# Patient Record
Sex: Male | Born: 1990 | Race: White | Hispanic: No | Marital: Single | Smoking: Current every day smoker
Health system: Southern US, Community
[De-identification: ages and names within clinical notes are randomized; demographics above are authoritative.]

---
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
## 2012-08-09 LAB — BASIC METABOLIC PANEL
Calcium, Total: 9.2 mg/dL (ref 8.5–10.1)
Co2: 31 mmol/L (ref 21–32)
Creatinine: 0.87 mg/dL (ref 0.60–1.30)
EGFR (African American): >60
Osmolality: 276 (ref 275–301)
Sodium: 139 mmol/L (ref 136–145)

## 2012-08-09 LAB — CBC WITH DIFFERENTIAL/PLATELET
Basophil #: 0 10*3/uL (ref 0.0–0.1)
Basophil %: 0.4 %
HGB: 15.2 g/dL (ref 13.0–18.0)
Lymphocyte #: 2.4 10*3/uL (ref 1.0–3.6)
Lymphocyte %: 27.5 %
MCH: 29.2 pg (ref 26.0–34.0)
MCV: 84 fL (ref 80–100)
Monocyte #: 0.6 x10 3/mm (ref 0.2–1.0)
Platelet: 231 10*3/uL (ref 150–440)
RBC: 5.19 10*6/uL (ref 4.40–5.90)
WBC: 8.9 10*3/uL (ref 3.8–10.6)

## 2012-08-09 LAB — PROTIME-INR: Prothrombin Time: 13.3 secs (ref 11.5–14.7)

## 2012-08-10 ENCOUNTER — Ambulatory Visit: Payer: Self-pay | Admitting: Orthopedic Surgery

## 2012-08-10 ENCOUNTER — Observation Stay: Payer: Self-pay | Admitting: Internal Medicine

## 2013-01-09 ENCOUNTER — Emergency Department: Payer: Self-pay | Admitting: Emergency Medicine

## 2014-05-14 NOTE — Discharge Summary (Signed)
PATIENT NAME:  Nicholas Villa, Nicholas Villa MR#:  [id number] DATE OF BIRTH:  Mar 03, 1990  DATE OF ADMISSION:  08/10/2012  ADMITTING PHYSICIAN:  Dr. Emeline Gins Elgergawy  DATE OF DISCHARGE:  08/10/2012  DISCHARGING PHYSICIAN:  Dr. Gladstone Lighter   PRIMARY PHYSICIAN: None.   CONSULTATIONS IN THE HOSPITAL: Dr. Mardi Mainland, orthopedic physician.   DISCHARGE DIAGNOSES: 1.  Right hand cellulitis with edema secondary to bug/snake bite.  2.  Tobacco use disorder.   DISCHARGE HOME MEDICATIONS:  1.  Percocet 5/325 mg 1 to 2 tablets every 4 hours as needed for pain.  2.  Clindamycin 300 mg p.o. q. 8 p.o. t.i.d. for 9 more days.   DISCHARGE DIET:  Regular diet.   DISCHARGE ACTIVITY:  As tolerated.    FOLLOWUP INSTRUCTIONS:  Ordered to follow up in 1 to 2 weeks if needed.   LABS AND IMAGING STUDIES PRIOR TO DISCHARGE:  Blood cultures remain negative. WBCs 8.9, hemoglobin 15.2, hematocrit 43.7, platelet count is 231. Sodium 139, potassium 4.0, chloride 105, bicarb 31, BUN 9, creatinine 0.87, glucose 101, calcium of 9.2. INR is 1.0. ESR within normal limits. CRP is negative as well.  Right hand x-ray showing no acute osseous abnormality of the right hand.   BRIEF HOSPITAL COURSE:  Mr. Scala is a 24 year old, young Caucasian male, with no significant past medical history, other than smoking. Comes to the hospital secondary to right hand swelling that happened when fishing, a  non-poisonous snake or bug bit him during fishing. He has a small bug bite mark on his index finger on the dorsal side, with soft tissue edema both on the dorsal and palmar side, with limiting flexion of his fingers and also at the wrist. However, symptoms improved one day into hospitalization. He was on fluids, IV vancomycin and Unasyn here. Was seen by orthopedic surgeon, and his x-ray was negative for any bone or joint involvement, so he recommended outpatient followup. The patient is being discharged on clindamycin for a total of 9  days, so he will finish the 10-day course of antibiotics, and can follow with Dr. Joie Bimler, orthopedic physician, in the next 1 to 2 weeks if symptoms do not improve. Recommended to minimize use of hand, and use as needed, and keep it elevated. His course has been otherwise uneventful in the hospital.   DISCHARGE CONDITION:  Stable.   DISCHARGE DISPOSITION:  Home.   Time spent on discharge is 40 minutes.      ____________________________ Gladstone Lighter, MD rk:mr D: 08/11/2012 14:53:00 ET T: 08/11/2012 20:32:26 ET JOB#: [id number]  cc: Gladstone Lighter, MD, <Dictator> Dawayne Patricia, MD  Gladstone Lighter MD ELECTRONICALLY SIGNED 08/18/2012 18:02

## 2014-05-14 NOTE — Consult Note (Signed)
PATIENT NAME:  Nicholas Villa, PETITJEAN MR#:  [id number] DATE OF BIRTH:  08/19/1990  DATE OF CONSULTATION:  08/10/2012  CONSULTING PHYSICIAN:  Maebelle Munroe, MD  CHIEF COMPLAINT: Right hand pain.   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 He complained of sharp intermittent throbbing, was diagnosed cellulitis was admitted for IV antibiotics last evening by the hospitalist service. He states that his pain and redness have improved since admitted.   PAST MEDICAL HISTORY: None.   PAST SURGICAL HISTORY: None.   ALLERGIES: No known drug allergies.   CURRENT MEDICATIONS: None.   FAMILY HISTORY: Remarkable for diabetes.   SOCIAL HISTORY: One half to 1 pack per day smoker. No alcohol use.   REVIEWOF SYSTEMS:  GENERAL: No fevers, chills, or shakes.   MUSCULOSKELETAL: Complains of right hand pain and swelling.   PHYSICAL EXAMINATION: GENERAL: Pleasant, alert young male appearing stated age, presenting with his girlfriend.  VITAL SIGNS: On presentation to the floor, respirations of 17, temperature of 97.6, pulse 54, blood pressure 124/64, 98% room air oxygen saturation.   LYMPHATIC: Moderate swelling of right hand. No epitrochlear or axillary lymphadenopathy on the right.  NEUROLOGIC: Motor and light touch sensation intact right upper extremity.  SKIN:  Shows small punctate eschar over the middle phalanx of the ring finger of the right hand.  MUSCULOSKELETAL: Reveals mild to moderate swelling predominantly dorsally of the right hand. The ring finger itself is not swollen. There is no pain with passive extension. There is no fusiform swelling of the ring finger. The posture of the hand and ring finger are normal. There is minimal erythema over the dorsal aspect of the hand. There is no erythema of the palmar  aspect of the hand. There is minimal tenderness to palpation over the flexor tendon course in  the right hand.  There are no lymphangitic streaks.   The patient was in a splint when I saw him this morning.   LABORATORY, DIAGNOSTIC AND RADILOGIC DATA:  Radiographs from last evening demonstrate mild soft tissue swelling dorsally and no foreign body fracture or dislocation.   Laboratory evaluation from yesterday revealed normal chemistries,  ESR of 1 and normal CBC. Blood cultures are preliminarily negative.   IMPRESSION:  Right hand cellulitis most likely secondary to bug bite.   PLAN: Okay to discharge the patient, continue to ice and elevate. Follow-up Dr. Joie Bimler on July 24 or July 25.  I discontinued IV antibiotics and sent patient home on clindamycin 300 mg orally three times a day for 10 day course. Follow up sooner if questions, concerns or problems.    ____________________________ Maebelle Munroe, MD jfs:cc D: 08/10/2012 13:55:54 ET T: 08/10/2012 15:38:20 ET JOB#: [id number]  cc: Maebelle Munroe, MD, <Dictator> Maebelle Munroe MD ELECTRONICALLY SIGNED 09/18/2012 22:25

## 2014-05-14 NOTE — H&P (Signed)
PATIENT NAME:  Nicholas Villa, Nicholas Villa MR#:  [id number] DATE OF BIRTH:  [birth year]  DATE OF ADMISSION:  08/10/2012  REFERRING PHYSICIAN:  Sharyn CreamerMark Quale, M.D.    PRIMARY CARE PHYSICIAN: None.   CHIEF COMPLAINT: Right-hand cellulitis.   HISTORY OF PRESENT ILLNESS: This is a 24 year old male without significant past medical history, presents with right hand swelling and tenderness. The patient reports he had an insect bite yesterday and over the last 24 hours, he did develop swelling and tenderness in his right hand. He reports he was fishing when he was bitten by a bug. He denies any fever, any chills, any loss of feeling of in his arm, any tingling or any numbness. Denies any nausea, any vomiting. As well, he denies any open wounds or any draining from the hand as well. ED physician spoke with the orthopedic on-call, who requested patient to be admitted and was to be started on IV antibiotics where he was going to evaluate him in a.m. as well. So, hospitalist service was requested to admit the patient for IV antibiotic administration for his hand cellulitis.   PAST MEDICAL HISTORY: None.   PAST SURGICAL HISTORY: None.   ALLERGIES: No known drug allergies.   HOME MEDICATIONS: None.   FAMILY HISTORY: Significant for type 1 diabetes mellitus in his brother.    SOCIAL HISTORY: The patient was just recently laid off. He used to work in [employer] business. He smokes a 1/2 pack to 1 pack per day. Denies any alcohol or illicit drug use.   REVIEW OF SYSTEMS:  CONSTITUTIONAL: Denies fever, chills, fatigue, weakness, pain,  EYES: Denies blurry vision, double vision, pain, inflammation, glaucoma.   EAR, NOSE, THROAT: Denies tinnitus, ear pain, hearing loss, epistaxis or discharge.  RESPIRATORY: Denies cough, wheezing, hemoptysis, COPD.  CARDIOVASCULAR: Denies chest pain, orthopnea, palpitations, syncope.  GASTROINTESTINAL: Denies nausea, vomiting, diarrhea, abdominal pain, hematemesis.   GENITOURINARY: Denies dysuria, hematuria, renal colic.  ENDOCRINE: Denies polyuria, polydipsia, heat or cold intolerance.  HEMATOLOGY: Denies anemia, easy bruising, bleeding diathesis.  INTEGUMENTARY: Denies acne, rash or skin lesions.  MUSCULOSKELETAL: Has right hand swelling, pain and redness. Denies any gout, limited activity or any arthritis.  NEUROLOGIC: Denies, vertigo, ataxia, dysarthria, weakness, tingling or numbness.  PSYCHIATRIC: Denies anxiety, insomnia, bipolar disorder.   PHYSICAL EXAMINATION: VITAL SIGNS: Temperature 97.5, pulse 79, respiratory rate 18, blood pressure 143/78, 99% on room air.  GENERAL: Young male looks comfortable, in no apparent distress.  HEENT: Head atraumatic, normocephalic. Pupils are equal, reactive to light. Pink conjunctivae. Anicteric sclerae. Moist oral mucosa.  NECK: Supple. No thyromegaly. No JVD.  CHEST: Good air entry bilaterally. No wheezing, rales or rhonchi.  CARDIOVASCULAR: S1, S2 heard. No rubs, murmurs, gallops.  ABDOMEN: Soft, nontender, nondistended. Bowel sounds present.  EXTREMITIES: No edema. No clubbing. No cyanosis  MUSCULOSKELETAL: Has right arm swelling and erythema, with tenderness on passive flexion, but has good pulses in all extremities. As well, has good capillary refill in the right fingers.  NEUROLOGIC: Cranial nerves grossly intact. Motor 5/. No focal deficits.  PSYCHIATRIC: Appropriate affect. Awake, alert x 3. Intact judgment and insight.   PERTINENT LABORATORY DATA: Glucose 101, BUN 9, creatinine 0.87, sodium 139, potassium 4, chloride 105, CO2 of 31. White blood cell 8.9, hemoglobin 15.2, hematocrit 43.7, platelets 231, INR 1.   ASSESSMENT AND PLAN: 1.  Right hand cellulitis. The patient developed right hand cellulitis after an insect bite, but he is having erythema with tenderness on passive flexion, so he will be  admitted for IV antibiotic administration. We will start him on IV vancomycin and will consult orthopedic  service to evaluate the patient and if he is cleared by ortho, he can be discharged on p.o. antibiotic, as well we will keep his right hand elevated. We will manage with p.Villa.n. pain medications as well.  2.  Tobacco abuse: The patient was counseled and will be started on NicoDerm patch.  3.  Deep vein thrombosis prophylaxis.  The patient is young and ambulatory. We will have him on sequential compression device.  4.  Code status: Full Code.   Time spent on admission and patient care: 45 minutes.    ____________________________ Starleen Arms, MD dse:nts D: 08/10/2012 00:15:58 ET T: 08/10/2012 01:25:44 ET JOB#: [id number]  cc: Starleen Arms, MD, <Dictator> Corgan Mormile Teena Irani MD ELECTRONICALLY SIGNED 08/10/2012 6:15

## 2014-05-19 ENCOUNTER — Emergency Department
Admit: 2014-05-19 | Disposition: A | Discharge status: Home / Self Care | Payer: Self-pay | Admitting: Emergency Medicine

## 2014-05-19 LAB — ACETAMINOPHEN LEVEL: Acetaminophen: <10 ug/mL

## 2014-05-19 LAB — COMPREHENSIVE METABOLIC PANEL
ALBUMIN: 4.4 g/dL
ANION GAP: 6 — AB (ref 7–16)
AST: 25 U/L
Alkaline Phosphatase: 99 U/L
BUN: 14 mg/dL
Bilirubin,Total: 1.8 mg/dL — ABNORMAL HIGH
CHLORIDE: 104 mmol/L
Calcium, Total: 9.2 mg/dL
Co2: 30 mmol/L
Creatinine: 0.65 mg/dL
EGFR (African American): >60
EGFR (Non-African Amer.): >60
GLUCOSE: 100 mg/dL — AB
POTASSIUM: 4.2 mmol/L
SGPT (ALT): 17 U/L
Sodium: 140 mmol/L
Total Protein: 6.9 g/dL

## 2014-05-19 LAB — CBC
HCT: 39.3 % — ABNORMAL LOW (ref 40.0–52.0)
HGB: 13.2 g/dL (ref 13.0–18.0)
MCH: 28.7 pg (ref 26.0–34.0)
MCHC: 33.6 g/dL (ref 32.0–36.0)
MCV: 86 fL (ref 80–100)
Platelet: 177 10*3/uL (ref 150–440)
RBC: 4.6 10*6/uL (ref 4.40–5.90)
RDW: 12.7 % (ref 11.5–14.5)
WBC: 5.9 10*3/uL (ref 3.8–10.6)

## 2014-05-19 LAB — ETHANOL

## 2014-05-19 LAB — SALICYLATE LEVEL
Salicylates, Serum: <4 mg/dL
Salicylates, Serum: <4 mg/dL

## 2014-06-22 ENCOUNTER — Encounter: Payer: Self-pay | Admitting: Emergency Medicine

## 2014-06-22 ENCOUNTER — Emergency Department
Admission: EM | Admit: 2014-06-22 | Discharge: 2014-06-22 | Payer: Medicaid Other | Attending: Emergency Medicine | Admitting: Emergency Medicine

## 2014-06-22 ENCOUNTER — Emergency Department: Payer: Medicaid Other

## 2014-06-22 DIAGNOSIS — Z720 Tobacco use: Secondary | ICD-10-CM | POA: Insufficient documentation

## 2014-06-22 DIAGNOSIS — Y9289 Other specified places as the place of occurrence of the external cause: Secondary | ICD-10-CM | POA: Diagnosis not present

## 2014-06-22 DIAGNOSIS — S022XXA Fracture of nasal bones, initial encounter for closed fracture: Secondary | ICD-10-CM | POA: Insufficient documentation

## 2014-06-22 DIAGNOSIS — Y9389 Activity, other specified: Secondary | ICD-10-CM | POA: Insufficient documentation

## 2014-06-22 DIAGNOSIS — S0083XA Contusion of other part of head, initial encounter: Secondary | ICD-10-CM | POA: Insufficient documentation

## 2014-06-22 DIAGNOSIS — F1410 Cocaine abuse, uncomplicated: Secondary | ICD-10-CM | POA: Insufficient documentation

## 2014-06-22 DIAGNOSIS — Y998 Other external cause status: Secondary | ICD-10-CM | POA: Diagnosis not present

## 2014-06-22 DIAGNOSIS — F1210 Cannabis abuse, uncomplicated: Secondary | ICD-10-CM

## 2014-06-22 DIAGNOSIS — S0990XA Unspecified injury of head, initial encounter: Secondary | ICD-10-CM | POA: Diagnosis present

## 2014-06-22 DIAGNOSIS — F1310 Sedative, hypnotic or anxiolytic abuse, uncomplicated: Secondary | ICD-10-CM | POA: Insufficient documentation

## 2014-06-22 LAB — URINE DRUG SCREEN, QUALITATIVE (ARMC ONLY)
AMPHETAMINES, UR SCREEN: NOT DETECTED
BARBITURATES, UR SCREEN: NOT DETECTED
BENZODIAZEPINE, UR SCRN: POSITIVE — AB
Cannabinoid 50 Ng, Ur ~~LOC~~: POSITIVE — AB
Cocaine Metabolite,Ur ~~LOC~~: POSITIVE — AB
MDMA (ECSTASY) UR SCREEN: NOT DETECTED
Methadone Scn, Ur: NOT DETECTED
Opiate, Ur Screen: NOT DETECTED
PHENCYCLIDINE (PCP) UR S: NOT DETECTED
TRICYCLIC, UR SCREEN: NOT DETECTED

## 2014-06-22 NOTE — Discharge Instructions (Signed)
Cannabis Use Disorder °Cannabis use disorder is a mental disorder. It is not one-time or occasional use of cannabis, more commonly known as marijuana. Cannabis use disorder is the continued, nonmedical use of cannabis that interferes with normal life activities or causes health problems. People with cannabis use disorder get a feeling of extreme pleasure and relaxation from cannabis use. This "high" is very rewarding and causes people to use over and over.  °The mind-altering ingredient in cannabis is know as THC. THC can also interfere with motor coordination, memory, judgment, and accurate sense of space and time. These effects can last for a few days after using cannabis. Regular heavy cannabis use can cause long-lasting problems with thinking and learning. In young people, these problems may be permanent. Cannabis sometimes causes severe anxiety, paranoia, or visual hallucinations. Man-made (synthetic) cannabis-like drugs, such as "spice" and "K2," cause the same effects as THC but are much stronger. Cannabis-like drugs can cause dangerously high blood pressure and heart rate.  °Cannabis use disorder usually starts in the teenage years. It can trigger the development of schizophrenia. It is somewhat more common in men than women. People who have family members with the disorder or existing mental health issues such as depression and posttraumatic stress disorder are more likely to develop cannabis use disorder. People with cannabis use disorder are at higher risk for use of other drugs of abuse.  °SIGNS AND SYMPTOMS °Signs and symptoms of cannabis use disorder include:  °· Use of cannabis in larger amounts or over a longer period than intended.   °· Unsuccessful attempts to cut down or control cannabis use.   °· A lot of time spent obtaining, using, or recovering from the effects of cannabis.   °· A strong desire or urge to use cannabis (cravings).   °· Continued use of cannabis in spite of problems at work,  school, or home because of use.   °· Continued use of cannabis in spite of relationship problems because of use. °· Giving up or cutting down on important life activities because of cannabis use. °· Use of cannabis over and over even in situations when it is physically hazardous, such as when driving a car.   °· Continued use of cannabis in spite of a physical problem that is likely related to use. Physical problems can include: °· Chronic cough. °· Bronchitis. °· Emphysema. °· Throat and lung cancer. °· Continued use of cannabis in spite of a mental problem that is likely related to use. Mental problems can include: °· Psychosis. °· Anxiety. °· Difficulty sleeping. °· Need to use more and more cannabis to get the same effect, or lessened effect over time with use of the same amount (tolerance). °· Having withdrawal symptoms when cannabis use is stopped, or using cannabis to reduce or avoid withdrawal symptoms. Withdrawal symptoms include: °· Irritability or anger. °· Anxiety or restlessness. °· Difficulty sleeping. °· Loss of appetite or weight. °· Aches and pains. °· Shakiness. °· Sweating. °· Chills. °DIAGNOSIS °Cannabis use disorder is diagnosed by your health care provider. You may be asked questions about your cannabis use and how it affects your life. A physical exam may be done. A drug screen may be done. You may be referred to a mental health professional. The diagnosis of cannabis use disorder requires at least two symptoms within 12 months. The type of cannabis use disorder you have depends on the number of symptoms you have. The type may be: °· Mild. Two or three signs and symptoms.   °· Moderate. Four or   five signs and symptoms.   Severe. Six or more signs and symptoms.  TREATMENT Treatment is usually provided by mental health professionals with training in substance use disorders. The following options are available:  Counseling or talk therapy. Talk therapy addresses the reasons you use  cannabis. It also addresses ways to keep you from using again. The goals of talk therapy include:  Identifying and avoiding triggers for use.  Learning how to handle cravings.  Replacing use with healthy activities.  Support groups. Support groups provide emotional support, advice, and guidance.  Medicine. Medicine is used to treat mental health issues that trigger cannabis use or that result from it. HOME CARE INSTRUCTIONS  Take medicines only as directed by your health care provider.  Check with your health care provider before starting any new medicines.  Keep all follow-up visits as directed by your health care provider. SEEK MEDICAL CARE IF:  You are not able to take your medicines as directed.  Your symptoms get worse. SEEK IMMEDIATE MEDICAL CARE IF: You have serious thoughts about hurting yourself or others. FOR MORE INFORMATION  National Institute on Drug Abuse: http://www.price-Nivan Melendrez.com/www.drugabuse.gov  Substance Abuse and Mental Health Services Administration: SkateOasis.com.ptwww.samhsa.gov Document Released: 01/06/2000 Document Revised: 05/25/2013 Document Reviewed: 01/21/2013 [facility] Patient Information 2015 [place], [place]. This information is not intended to replace advice given to you by your health care provider. Make sure you discuss any questions you have with your health care provider.  Chemical Dependency Chemical dependency is an addiction to drugs or alcohol. It is characterized by the repeated behavior of seeking out and using drugs and alcohol despite harmful consequences to the health and safety of ones self and others.  RISK FACTORS There are certain situations or behaviors that increase a person's risk for chemical dependency. These include:  A family history of chemical dependency.  A history of mental health issues, including depression and anxiety.  A home environment where drugs and alcohol are easily available to you.  Drug or alcohol use at a young age. SYMPTOMS  The following  symptoms can indicate chemical dependency:  Inability to limit the use of drugs or alcohol.  Nausea, sweating, shakiness, and anxiety that occurs when alcohol or drugs are not being used.  An increase in amount of drugs or alcohol that is necessary to get drunk or high. People who experience these symptoms can assess their use of drugs and alcohol by asking themselves the following questions:  Have you been told by friends or family that they are worried about your use of alcohol or drugs?  Do friends and family ever tell you about things you did while drinking alcohol or using drugs that you do not remember?  Do you lie about using alcohol or drugs or about the amounts you use?  Do you have difficulty completing daily tasks unless you use alcohol or drugs?  Is the level of your work or school performance lower because of your drug or alcohol use?  Do you get sick from using drugs or alcohol but keep using anyway?  Do you feel uncomfortable in social situations unless you use alcohol or drugs?  Do you use drugs or alcohol to help forget problems? An answer of yes to any of these questions may indicate chemical dependency. Professional evaluation is suggested. Document Released: 01/02/2001 Document Revised: 04/02/2011 Document Reviewed: 03/16/2010 [facility] Patient Information 2015 [place], [place]. This information is not intended to replace advice given to you by your health care provider. Make sure you discuss  any questions you have with your health care provider. ° °
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
## 2015-07-19 ENCOUNTER — Encounter: Payer: Self-pay | Admitting: Emergency Medicine

## 2015-07-19 ENCOUNTER — Emergency Department: Payer: Medicaid Other

## 2015-07-19 ENCOUNTER — Emergency Department
Admission: EM | Admit: 2015-07-19 | Discharge: 2015-07-19 | Disposition: A | Discharge status: Home / Self Care | Payer: Medicaid Other | Attending: Emergency Medicine | Admitting: Emergency Medicine

## 2015-07-19 DIAGNOSIS — R509 Fever, unspecified: Secondary | ICD-10-CM | POA: Diagnosis present

## 2015-07-19 DIAGNOSIS — J189 Pneumonia, unspecified organism: Secondary | ICD-10-CM

## 2015-07-19 DIAGNOSIS — J181 Lobar pneumonia, unspecified organism: Secondary | ICD-10-CM | POA: Insufficient documentation

## 2015-07-19 DIAGNOSIS — F17210 Nicotine dependence, cigarettes, uncomplicated: Secondary | ICD-10-CM | POA: Insufficient documentation

## 2015-07-19 MED ORDER — IPRATROPIUM-ALBUTEROL 0.5-2.5 (3) MG/3ML IN SOLN
3.0000 mL | Freq: Once | RESPIRATORY_TRACT | Status: AC
  Administered 2015-07-19: 3 mL via RESPIRATORY_TRACT
  Filled 2015-07-19: qty 3

## 2015-07-19 MED ORDER — ALBUTEROL SULFATE HFA 108 (90 BASE) MCG/ACT IN AERS: 2.0000 | INHALATION_SPRAY | Freq: Four times a day (QID) | RESPIRATORY_TRACT | Status: AC | PRN

## 2015-07-19 MED ORDER — AZITHROMYCIN 250 MG PO TABS: 250.0000 mg | ORAL_TABLET | Freq: Every day | ORAL | Status: AC

## 2015-07-19 MED ORDER — AZITHROMYCIN 500 MG PO TABS
500.0000 mg | ORAL_TABLET | Freq: Once | ORAL | Status: AC
  Administered 2015-07-19: 500 mg via ORAL
  Filled 2015-07-19: qty 1

## 2015-07-19 MED ORDER — BENZONATATE 100 MG PO CAPS: 100.0000 mg | ORAL_CAPSULE | Freq: Three times a day (TID) | ORAL | Status: AC | PRN

## 2015-07-19 NOTE — Discharge Instructions (Signed)
Community-Acquired Pneumonia, Adult Pneumonia is an infection of the lungs. One type of pneumonia can happen while a person is in a hospital. A different type can happen when a person is not in a hospital (community-acquired pneumonia). It is easy for this kind to spread from person to person. It can spread to you if you breathe near an infected person who coughs or sneezes. Some symptoms include:  A dry cough.  A wet (productive) cough.  Fever.  Sweating.  Chest pain. HOME CARE  Take over-the-counter and prescription medicines only as told by your doctor.  Only take cough medicine if you are losing sleep.  If you were prescribed an antibiotic medicine, take it as told by your doctor. Do not stop taking the antibiotic even if you start to feel better.  Sleep with your head and neck raised (elevated). You can do this by putting a few pillows under your head, or you can sleep in a recliner.  Do not use tobacco products. These include cigarettes, chewing tobacco, and e-cigarettes. If you need help quitting, ask your doctor.  Drink enough water to keep your pee (urine) clear or pale yellow. A shot (vaccine) can help prevent pneumonia. Shots are often suggested for:  People older than 23 years of age.  People older than 23 years of age:  Who are having cancer treatment.  Who have long-term (chronic) lung disease.  Who have problems with their body's defense system (immune system). You may also prevent pneumonia if you take these actions:  Get the flu (influenza) shot every year.  Go to the dentist as often as told.  Wash your hands often. If soap and water are not available, use hand sanitizer. GET HELP IF:  You have a fever.  You lose sleep because your cough medicine does not help. GET HELP RIGHT AWAY IF:  You are short of breath and it gets worse.  You have more chest pain.  Your sickness gets worse. This is very serious if:  You are an older adult.  Your  body's defense system is weak.  You cough up blood.   This information is not intended to replace advice given to you by your health care provider. Make sure you discuss any questions you have with your health care provider.   Document Released: 06/27/2007 Document Revised: 09/29/2014 Document Reviewed: 05/05/2014 Elsevier Interactive Patient Education [employer]2016 [employer]   Take the antibiotic as directed. Continue to monitor and treat fevers. Increase fluid intake to prevent dehydration and to help clear lung secretions. [facility] with [facility] for continued symptoms.

## 2015-07-19 NOTE — ED Provider Notes (Signed)
[facility] Emergency Department Provider Note ____________________________________________  Time seen: 2033  I have reviewed the triage vital signs and the nursing notes.  HISTORY  Chief Complaint  Cough; Fever; and Generalized Body Aches   HPI Nicholas Villa is a 25 y.o. male presents to the ED with a one-week complaint of productive cough, fevers, and body aches.He reports pain to the left side of the chest as well as chills, decreased energy, and intermittent sweats. Denies any history of asthma or bronchitis. He has been dosing Tylenol and Motrin for fevers in the interim. Reports generalized body aches and 8/10 in triage.  History reviewed. No pertinent past medical history.  There are no active problems to display for this patient.   History reviewed. No pertinent past surgical history.  Current Outpatient Rx  Name  Route  Sig  Dispense  Refill  . albuterol (PROVENTIL HFA;VENTOLIN HFA) 108 (90 Base) MCG/ACT inhaler   Inhalation   Inhale 2 puffs into the lungs every 6 (six) hours as needed for wheezing or shortness of breath.   1 Inhaler   0   . azithromycin (ZITHROMAX) 250 MG tablet   Oral   Take 1 tablet (250 mg total) by mouth daily.   4 each   0   . benzonatate (TESSALON PERLES) 100 MG capsule   Oral   Take 1 capsule (100 mg total) by mouth 3 (three) times daily as needed for cough (Take 1-2 per dose).   30 capsule   0    Allergies Review of patient's allergies indicates no known allergies.  No family history on file.  Social History Social History  Substance Use Topics  . Smoking status: Current Every Day Smoker -- 0.50 packs/day    Types: Cigarettes  . Smokeless tobacco: Never Used  . Alcohol Use: No    Review of Systems  Constitutional: Positive for fever. Eyes: Negative for visual changes. ENT: Negative for sore throat. Cardiovascular: Negative for chest pain. Respiratory: Positive for shortness of breath and  productive cough. Gastrointestinal: Negative for abdominal pain, vomiting and diarrhea. ____________________________________________  PHYSICAL EXAM:  VITAL SIGNS: ED Triage Vitals  Enc Vitals Group     BP 07/19/15 1928 123/68 mmHg     Pulse Rate 07/19/15 1928 77     Resp 07/19/15 1928 18     Temp 07/19/15 1928 98 F (36.7 C)     Temp Source 07/19/15 1928 Oral     SpO2 07/19/15 1928 100 %     Weight 07/19/15 1928 170 lb (77.111 kg)     Height 07/19/15 1928 6\' 1"  (1.854 m)     Head Cir --      Peak Flow --      Pain Score 07/19/15 1928 8     Pain Loc --      Pain Edu? --      Excl. in GC? --    Constitutional: Alert and oriented. Well appearing and in no distress. Head: Normocephalic and atraumatic.      Eyes: Conjunctivae are normal. PERRL. Normal extraocular movements      Ears: Canals clear. TMs intact bilaterally.   Nose: No congestion/rhinorrhea.   Mouth/Throat: Mucous membranes are moist.   Neck: Supple. No thyromegaly. Cardiovascular: Normal rate, regular rhythm.  Respiratory: Normal respiratory effort. No wheezes/rales/rhonchi. Gastrointestinal: Soft and nontender. No distention. Musculoskeletal: Nontender with normal range of motion in all extremities.  Neurologic: No gross focal neurologic deficits are appreciated. Skin:  Skin is warm,  dry and intact. No rash noted. ____________________________________________   RADIOLOGY  CXR IMPRESSION: Left basilar infiltrate. ____________________________________________  PROCEDURES  Azithromycin 500 mg PO Duoneb x 1 ____________________________________________  INITIAL IMPRESSION / ASSESSMENT AND PLAN / ED COURSE  Patient with treatment for a Chlamydia quite pneumonia. He is given first dose of azithromycin and discharged with a prescription for the remaining 4 pills. He is also discharged with PCP as for albuterol and Tessalon Perles. He will increase fluid intake and follow with his primary care provider  for worsening symptoms or as necessary. ____________________________________________  FINAL CLINICAL IMPRESSION(S) / ED DIAGNOSES  Final diagnoses:  CAP (community acquired pneumonia)  LLL pneumonia     Lissa HoardJenise V Bacon Nichalas Coin, PA-C 07/19/15 2205  Governor Rooksebecca Lord, MD 07/20/15 [phone]

## 2015-07-19 NOTE — ED Notes (Signed)
Pt presents to ED with productive cough, fever, and body aches. Pt states onset of symptoms over a week ago. No increased work of breathing or acute distress noted at this time.

## 2017-05-08 ENCOUNTER — Emergency Department (HOSPITAL_COMMUNITY)
Admission: EM | Admit: 2017-05-08 | Discharge: 2017-05-08 | Disposition: A | Discharge status: Home / Self Care | Payer: Self-pay | Attending: Emergency Medicine | Admitting: Emergency Medicine

## 2017-05-08 ENCOUNTER — Other Ambulatory Visit: Payer: Self-pay

## 2017-05-08 ENCOUNTER — Encounter (HOSPITAL_COMMUNITY): Payer: Self-pay | Admitting: Emergency Medicine

## 2017-05-08 DIAGNOSIS — H1013 Acute atopic conjunctivitis, bilateral: Secondary | ICD-10-CM | POA: Insufficient documentation

## 2017-05-08 DIAGNOSIS — F17210 Nicotine dependence, cigarettes, uncomplicated: Secondary | ICD-10-CM | POA: Insufficient documentation

## 2017-05-08 MED ORDER — ERYTHROMYCIN 5 MG/GM OP OINT
TOPICAL_OINTMENT | Freq: Once | OPHTHALMIC | Status: AC
  Administered 2017-05-08: 20:00:00 via OPHTHALMIC
  Filled 2017-05-08: qty 3.5

## 2017-05-08 MED ORDER — KETOROLAC TROMETHAMINE 0.5 % OP SOLN
1.0000 [drp] | Freq: Once | OPHTHALMIC | Status: AC
  Administered 2017-05-08: 1 [drp] via OPHTHALMIC
  Filled 2017-05-08: qty 5

## 2017-05-08 NOTE — ED Triage Notes (Signed)
Pt c/o of eye pain with swelling, yellow drainage, and intermittent itching x 3 days.

## 2017-05-08 NOTE — Discharge Instructions (Addendum)
Your exam is most suggestive of an allergic inflammatory reaction, but you are also being treated for infectious conjunctivitis to make sure you get better.  Apply 1 drop of the ketorolac (acular) in each eye every 6 hours (this will help with inflammation) then apply a small ribbon of the antibiotic ointment also 4 times daily.

## 2017-05-08 NOTE — ED Provider Notes (Signed)
[facility] EMERGENCY DEPARTMENT Provider Note   CSN: [id number] Arrival date & time: 05/08/17  1757     History   Chief Complaint Chief Complaint  Patient presents with  . Eye Pain    HPI Nicholas Villa R Lupi is a 27 y.o. male presenting with predominantly left eye swelling, drainage and intermittent itching for the past 3 days, although his right upper medial eyelid also has swelling and is itchy since he was exposed to steam coming off of a outdoor grill 3 days ago.  He denies pain in his eyes and has had no change in visual acuity.  His left eye has been tearing, no purulent drainage.  He has been using an allergy eyedrop which seems to make his symptoms worse.  The history is provided by the patient.    History reviewed. No pertinent past medical history.  There are no active problems to display for this patient.   History reviewed. No pertinent surgical history.      Home Medications    Prior to Admission medications   Medication Sig Start Date End Date Taking? Authorizing Provider  albuterol (PROVENTIL HFA;VENTOLIN HFA) 108 (90 Base) MCG/ACT inhaler Inhale 2 puffs into the lungs every 6 (six) hours as needed for wheezing or shortness of breath. 07/19/15   Menshew, Charlesetta IvoryJenise V Bacon, PA-C  benzonatate (TESSALON PERLES) 100 MG capsule Take 1 capsule (100 mg total) by mouth 3 (three) times daily as needed for cough (Take 1-2 per dose). 07/19/15   Menshew, Charlesetta IvoryJenise V Bacon, PA-C    Family History History reviewed. No pertinent family history.  Social History Social History   Tobacco Use  . Smoking status: Current Every Day Smoker    Packs/day: 0.50    Types: Cigarettes  . Smokeless tobacco: Never Used  Substance Use Topics  . Alcohol use: No  . Drug use: No     Allergies   Patient has no known allergies.   Review of Systems Review of Systems  Constitutional: Negative for chills and fever.  HENT: Negative for congestion, ear pain, rhinorrhea, sinus pressure,  sore throat, trouble swallowing and voice change.   Eyes: Positive for discharge, redness and itching. Negative for pain.  Respiratory: Negative for cough, shortness of breath, wheezing and stridor.   Cardiovascular: Negative for chest pain.  Gastrointestinal: Negative for abdominal pain.  Genitourinary: Negative.      Physical Exam Updated Vital Signs BP (!) 134/91 (BP Location: Right Arm)   Pulse 84   Temp 98.3 F (36.8 C) (Oral)   Resp 18   Ht 6\' 1"  (1.854 m)   Wt 74.8 kg (165 lb)   SpO2 96%   BMI 21.77 kg/m   Physical Exam  Constitutional: He is oriented to person, place, and time. He appears well-developed and well-nourished.  HENT:  Head: Normocephalic and atraumatic.  Right Ear: Tympanic membrane and ear canal normal.  Left Ear: Tympanic membrane and ear canal normal.  Nose: Mucosal edema and rhinorrhea present.  Mouth/Throat: Uvula is midline, oropharynx is clear and moist and mucous membranes are normal. No oropharyngeal exudate, posterior oropharyngeal edema, posterior oropharyngeal erythema or tonsillar abscesses.  Eyes: Pupils are equal, round, and reactive to light. EOM are normal. Left conjunctiva is injected.  Clear tearing from left eye.  He has mild edema and scaling rash noted along left upper eyelid and also right upper eyelid but only of the medial third of the right leg.  The right eye has no erythema or drainage.  Bilateral Distance: 20/40  R Distance: 20/40  L Distance: 20/40     Cardiovascular: Normal rate and normal heart sounds.  Pulmonary/Chest: Effort normal. No respiratory distress. He has no wheezes. He has no rales.  Musculoskeletal: Normal range of motion.  Neurological: He is alert and oriented to person, place, and time.  Skin: Skin is warm and dry. No rash noted.  Psychiatric: He has a normal mood and affect.     ED Treatments / Results  Labs (all labs ordered are listed, but only abnormal results are displayed) Labs Reviewed - No  data to display  EKG None  Radiology No results found.  Procedures Procedures (including critical care time)  Medications Ordered in ED Medications  erythromycin ophthalmic ointment (has no administration in time range)  ketorolac (ACULAR) 0.5 % ophthalmic solution 1 drop (has no administration in time range)     Initial Impression / Assessment and Plan / ED Course  I have reviewed the triage vital signs and the nursing notes.  Pertinent labs & imaging results that were available during my care of the patient were reviewed by me and considered in my medical decision making (see chart for details).     Patient's exam is consistent with an atopic conjunctivitis with eyelid involvement.  However he will be covered for possible infectious conjunctivitis.  He was placed on ketorolac drops for inflammation and also given erythromycin ointment for eye and eyelid use.  Referred to ophthalmology for follow-up if symptoms are not improving over the next 5 days.  His symptoms got worse since using the OTC allergy eyedrops he was advised to discontinue this medication.  Final Clinical Impressions(s) / ED Diagnoses   Final diagnoses:  Acute atopic conjunctivitis of both eyes    ED Discharge Orders    None       Victoriano Lain 05/08/17 1934    Eber Hong, MD 05/19/17 1453

## 2017-12-15 ENCOUNTER — Encounter (HOSPITAL_COMMUNITY): Payer: Self-pay | Admitting: Emergency Medicine

## 2017-12-15 ENCOUNTER — Other Ambulatory Visit: Payer: Self-pay

## 2017-12-15 ENCOUNTER — Emergency Department (HOSPITAL_COMMUNITY): Payer: Medicaid Other

## 2017-12-15 ENCOUNTER — Emergency Department (HOSPITAL_COMMUNITY)
Admission: EM | Admit: 2017-12-15 | Discharge: 2017-12-15 | Disposition: A | Discharge status: Home / Self Care | Payer: Medicaid Other | Attending: Emergency Medicine | Admitting: Emergency Medicine

## 2017-12-15 DIAGNOSIS — R197 Diarrhea, unspecified: Secondary | ICD-10-CM | POA: Insufficient documentation

## 2017-12-15 DIAGNOSIS — F17210 Nicotine dependence, cigarettes, uncomplicated: Secondary | ICD-10-CM | POA: Insufficient documentation

## 2017-12-15 DIAGNOSIS — R1110 Vomiting, unspecified: Secondary | ICD-10-CM

## 2017-12-15 DIAGNOSIS — D72819 Decreased white blood cell count, unspecified: Secondary | ICD-10-CM | POA: Insufficient documentation

## 2017-12-15 DIAGNOSIS — Z79899 Other long term (current) drug therapy: Secondary | ICD-10-CM | POA: Insufficient documentation

## 2017-12-15 LAB — COMPREHENSIVE METABOLIC PANEL
ALBUMIN: 4.4 g/dL (ref 3.5–5.0)
ALT: 21 U/L (ref 0–44)
AST: 38 U/L (ref 15–41)
Alkaline Phosphatase: 72 U/L (ref 38–126)
Anion gap: 8 (ref 5–15)
BUN: 8 mg/dL (ref 6–20)
CO2: 31 mmol/L (ref 22–32)
Calcium: 9.1 mg/dL (ref 8.9–10.3)
Chloride: 99 mmol/L (ref 98–111)
Creatinine, Ser: 0.66 mg/dL (ref 0.61–1.24)
GFR calc Af Amer: >60 mL/min (ref >60)
GLUCOSE: 129 mg/dL — AB (ref 70–99)
Potassium: 3.6 mmol/L (ref 3.5–5.1)
Sodium: 138 mmol/L (ref 135–145)
Total Bilirubin: 0.7 mg/dL (ref 0.3–1.2)
Total Protein: 8.1 g/dL (ref 6.5–8.1)

## 2017-12-15 LAB — CBC WITH DIFFERENTIAL/PLATELET
Abs Immature Granulocytes: 0 10*3/uL (ref 0.00–0.07)
BASOS ABS: 0 10*3/uL (ref 0.0–0.1)
Basophils Relative: 1 %
EOS PCT: 1 %
Eosinophils Absolute: 0 10*3/uL (ref 0.0–0.5)
HEMATOCRIT: 45.7 % (ref 39.0–52.0)
Hemoglobin: 16.1 g/dL (ref 13.0–17.0)
Immature Granulocytes: 0 %
LYMPHS ABS: 1.9 10*3/uL (ref 0.7–4.0)
LYMPHS PCT: 62 %
MCH: 29.4 pg (ref 26.0–34.0)
MCHC: 35.2 g/dL (ref 30.0–36.0)
MCV: 83.4 fL (ref 80.0–100.0)
MONO ABS: 0.4 10*3/uL (ref 0.1–1.0)
Monocytes Relative: 12 %
Neutro Abs: 0.7 10*3/uL — ABNORMAL LOW (ref 1.7–7.7)
Neutrophils Relative %: 24 %
Platelets: 109 10*3/uL — ABNORMAL LOW (ref 150–400)
RBC: 5.48 MIL/uL (ref 4.22–5.81)
RDW: 11.5 % (ref 11.5–15.5)
WBC: 3 10*3/uL — AB (ref 4.0–10.5)
nRBC: 0 % (ref 0.0–0.2)

## 2017-12-15 MED ORDER — ONDANSETRON 4 MG PO TBDP: ORAL_TABLET | ORAL | 0 refills | Status: AC

## 2017-12-15 MED ORDER — ONDANSETRON HCL 4 MG/2ML IJ SOLN
4.0000 mg | Freq: Once | INTRAMUSCULAR | Status: AC
  Administered 2017-12-15: 4 mg via INTRAVENOUS
  Filled 2017-12-15: qty 2

## 2017-12-15 MED ORDER — SODIUM CHLORIDE 0.9 % IV BOLUS
1000.0000 mL | Freq: Once | INTRAVENOUS | Status: AC
  Administered 2017-12-15: 1000 mL via INTRAVENOUS

## 2017-12-15 MED ORDER — KETOROLAC TROMETHAMINE 30 MG/ML IJ SOLN
30.0000 mg | Freq: Once | INTRAMUSCULAR | Status: AC
  Administered 2017-12-15: 30 mg via INTRAVENOUS
  Filled 2017-12-15: qty 1

## 2017-12-15 NOTE — Discharge Instructions (Addendum)
Follow-up with a family doctor or Dr. Ellin SabaKatragadda in a week.  Call for an appointment.  Return sooner if problems.  Drink plenty of fluids take Tylenol or Motrin for fevers and aches

## 2017-12-15 NOTE — ED Triage Notes (Signed)
Patient c/o nausea, vomiting, diarrhea, fevers, and productive cough that started no Tuesday. Per patient thick white sputum. Patient taking daytime flu and night quil with no relief. Denies any urinary symptoms. Per patient daughter has had same symptoms. r

## 2017-12-15 NOTE — ED Provider Notes (Signed)
[facility]GENCY [facility]PARTMENT Provider Note   CSN: [id number] Arrival date & time: 12/15/17  1420     History   Chief Complaint Chief Complaint  Patient presents with  . Emesis    HPI Nicholas Villa is a 27 y.o. male.  Patient states she is been having cough and aches and some vomiting for a week.  Patient states he states been getting some better but needs a note for work  The history is provided by the patient. No language interpreter was used.  Emesis   This is a new problem. The current episode started more than 2 days ago. The problem occurs 2 to 4 times per day. The problem has been gradually improving. The emesis has an appearance of stomach contents. There has been no fever. Associated symptoms include cough. Pertinent negatives include no abdominal pain, no chills, no diarrhea and no headaches. Risk factors include ill contacts.    History reviewed. No pertinent past medical history.  There are no active problems to display for this patient.   History reviewed. No pertinent surgical history.      Home Medications    Prior to Admission medications   Medication Sig Start Date End Date Taking? Authorizing Provider  albuterol (PROVENTIL HFA;VENTOLIN HFA) 108 (90 Base) MCG/ACT inhaler Inhale 2 puffs into the lungs every 6 (six) hours as needed for wheezing or shortness of breath. 07/19/15   Menshew, Charlesetta IvoryJenise V Bacon, PA-C  benzonatate (TESSALON PERLES) 100 MG capsule Take 1 capsule (100 mg total) by mouth 3 (three) times daily as needed for cough (Take 1-2 per dose). 07/19/15   Menshew, Charlesetta IvoryJenise V Bacon, PA-C  ondansetron (ZOFRAN ODT) 4 MG disintegrating tablet 4mg  ODT q4 hours prn nausea/vomit 12/15/17   Bethann BerkshireZammit, Quinterious Walraven, MD    Family History No family history on file.  Social History Social History   Tobacco Use  . Smoking status: Current Every Day Smoker    Packs/day: 0.50    Types: Cigarettes  . Smokeless tobacco: Never Used  Substance Use Topics  .  Alcohol use: No  . Drug use: No     Allergies   Patient has no known allergies.   Review of Systems Review of Systems  Constitutional: Negative for appetite change, chills and fatigue.  HENT: Negative for congestion, ear discharge and sinus pressure.   Eyes: Negative for discharge.  Respiratory: Positive for cough.   Cardiovascular: Negative for chest pain.  Gastrointestinal: Positive for vomiting. Negative for abdominal pain and diarrhea.  Genitourinary: Negative for frequency and hematuria.  Musculoskeletal: Negative for back pain.  Skin: Negative for rash.  Neurological: Negative for seizures and headaches.  Psychiatric/Behavioral: Negative for hallucinations.     Physical Exam Updated Vital Signs BP 96/62   Pulse 62   Temp 98.1 F (36.7 C) (Oral)   Resp 18   Ht 6\' 1"  (1.854 m)   Wt 72.6 kg   SpO2 100%   BMI 21.11 kg/m   Physical Exam  Constitutional: He is oriented to person, place, and time. He appears well-developed.  HENT:  Head: Normocephalic.  Eyes: Conjunctivae and EOM are normal. No scleral icterus.  Neck: Neck supple. No thyromegaly present.  Cardiovascular: Normal rate and regular rhythm. Exam reveals no gallop and no friction rub.  No murmur heard. Pulmonary/Chest: No stridor. He has no wheezes. He has no rales. He exhibits no tenderness.  Abdominal: He exhibits no distension. There is no tenderness. There is no rebound.  Musculoskeletal: Normal range of  motion. He exhibits no edema.  Lymphadenopathy:    He has no cervical adenopathy.  Neurological: He is oriented to person, place, and time. He exhibits normal muscle tone. Coordination normal.  Skin: No rash noted. No erythema.  Psychiatric: He has a normal mood and affect. His behavior is normal.     ED Treatments / Results  Labs (all labs ordered are listed, but only abnormal results are displayed) Labs Reviewed  CBC WITH DIFFERENTIAL/PLATELET - Abnormal; Notable for the following  components:      Result Value   WBC 3.0 (*)    Platelets 109 (*)    Neutro Abs 0.7 (*)    All other components within normal limits  COMPREHENSIVE METABOLIC PANEL - Abnormal; Notable for the following components:   Glucose, Bld 129 (*)    All other components within normal limits    EKG None  Radiology Dg Abd Acute W/chest  Result Date: 12/15/2017 CLINICAL DATA:  27 year old male with history of vomiting, body aches and fever. EXAM: DG ABDOMEN ACUTE W/ 1V CHEST COMPARISON:  Chest x-ray 07/19/2015. FINDINGS: Lung volumes are normal. No consolidative airspace disease. No pleural effusions. No pneumothorax. No pulmonary nodule or mass noted. Pulmonary vasculature and the cardiomediastinal silhouette are within normal limits. Gas and stool are seen scattered throughout the colon extending to the level of the distal rectum. No pathologic distension of small bowel is noted. No gross evidence of pneumoperitoneum. IMPRESSION: 1. Nonobstructive bowel gas pattern. 2. No pneumoperitoneum. 3. No radiographic evidence of acute cardiopulmonary disease. Electronically Signed   By: Trudie Reed M.D.   On: 12/15/2017 15:52    Procedures Procedures (including critical care time)  Medications Ordered in ED Medications  sodium chloride 0.9 % bolus 1,000 mL (1,000 mLs Intravenous New Bag/Given 12/15/17 1543)  ondansetron (ZOFRAN) injection 4 mg (4 mg Intravenous Given 12/15/17 1544)  ketorolac (TORADOL) 30 MG/ML injection 30 mg (30 mg Intravenous Given 12/15/17 1544)     Initial Impression / Assessment and Plan / ED Course  I have reviewed the triage vital signs and the nursing notes.  Pertinent labs & imaging results that were available during my care of the patient were reviewed by me and considered in my medical decision making (see chart for details).     With cough and vomiting.  Suspect bowel infection.  Patient has leukopenia and low platelets.  Patient is given Zofran to be taken at  home and is referred to oncology for follow-up on the low platelets and white count  Final Clinical Impressions(s) / ED Diagnoses   Final diagnoses:  Vomiting and diarrhea  Leukopenia, unspecified type    ED Discharge Orders         Ordered    ondansetron (ZOFRAN ODT) 4 MG disintegrating tablet     12/15/17 1643           Bethann Berkshire, MD 12/15/17 1650

## 2019-08-05 ENCOUNTER — Other Ambulatory Visit: Payer: Self-pay

## 2019-08-05 DIAGNOSIS — Z5321 Procedure and treatment not carried out due to patient leaving prior to being seen by health care provider: Secondary | ICD-10-CM | POA: Diagnosis not present

## 2019-08-05 DIAGNOSIS — M79671 Pain in right foot: Secondary | ICD-10-CM | POA: Diagnosis present

## 2019-08-05 NOTE — ED Triage Notes (Signed)
Pt to the er for a lac to the lateral right foot that has been there 2 weeks. Pt has been using neosporin. Scarbbing on the lac. Pt wanting antibiotics.

## 2019-08-06 ENCOUNTER — Emergency Department (HOSPITAL_COMMUNITY)
Admission: EM | Admit: 2019-08-06 | Discharge: 2019-08-06 | Discharge status: Against Medical Advice | Payer: Medicaid Other

## 2019-08-06 ENCOUNTER — Emergency Department
Admission: EM | Admit: 2019-08-06 | Discharge: 2019-08-06 | Disposition: A | Discharge status: Home / Self Care | Payer: Medicaid Other | Attending: Emergency Medicine | Admitting: Emergency Medicine

## 2019-08-31 ENCOUNTER — Other Ambulatory Visit: Payer: Self-pay

## 2019-08-31 ENCOUNTER — Emergency Department (HOSPITAL_COMMUNITY): Payer: Medicaid Other

## 2019-08-31 ENCOUNTER — Emergency Department (HOSPITAL_COMMUNITY)
Admission: EM | Admit: 2019-08-31 | Discharge: 2019-08-31 | Disposition: A | Discharge status: Home / Self Care | Payer: Medicaid Other | Attending: Emergency Medicine | Admitting: Emergency Medicine

## 2019-08-31 ENCOUNTER — Encounter (HOSPITAL_COMMUNITY): Payer: Self-pay

## 2019-08-31 DIAGNOSIS — W01110A Fall on same level from slipping, tripping and stumbling with subsequent striking against sharp glass, initial encounter: Secondary | ICD-10-CM | POA: Insufficient documentation

## 2019-08-31 DIAGNOSIS — S51812A Laceration without foreign body of left forearm, initial encounter: Secondary | ICD-10-CM | POA: Insufficient documentation

## 2019-08-31 DIAGNOSIS — Y9389 Activity, other specified: Secondary | ICD-10-CM | POA: Diagnosis not present

## 2019-08-31 DIAGNOSIS — Y929 Unspecified place or not applicable: Secondary | ICD-10-CM | POA: Diagnosis not present

## 2019-08-31 DIAGNOSIS — S51012A Laceration without foreign body of left elbow, initial encounter: Secondary | ICD-10-CM | POA: Insufficient documentation

## 2019-08-31 DIAGNOSIS — Y998 Other external cause status: Secondary | ICD-10-CM | POA: Insufficient documentation

## 2019-08-31 DIAGNOSIS — S41111A Laceration without foreign body of right upper arm, initial encounter: Secondary | ICD-10-CM

## 2019-08-31 LAB — CBC WITH DIFFERENTIAL/PLATELET
Abs Immature Granulocytes: 0.03 10*3/uL (ref 0.00–0.07)
Basophils Absolute: 0.1 10*3/uL (ref 0.0–0.1)
Basophils Relative: 1 %
Eosinophils Absolute: 0.3 10*3/uL (ref 0.0–0.5)
Eosinophils Relative: 3 %
HCT: 43.3 % (ref 39.0–52.0)
Hemoglobin: 14.3 g/dL (ref 13.0–17.0)
Immature Granulocytes: 0 %
Lymphocytes Relative: 20 %
Lymphs Abs: 1.9 10*3/uL (ref 0.7–4.0)
MCH: 28.3 pg (ref 26.0–34.0)
MCHC: 33 g/dL (ref 30.0–36.0)
MCV: 85.7 fL (ref 80.0–100.0)
Monocytes Absolute: 0.6 10*3/uL (ref 0.1–1.0)
Monocytes Relative: 6 %
Neutro Abs: 6.5 10*3/uL (ref 1.7–7.7)
Neutrophils Relative %: 70 %
Platelets: 255 10*3/uL (ref 150–400)
RBC: 5.05 MIL/uL (ref 4.22–5.81)
RDW: 11.6 % (ref 11.5–15.5)
WBC: 9.4 10*3/uL (ref 4.0–10.5)
nRBC: 0 % (ref 0.0–0.2)

## 2019-08-31 LAB — PROTIME-INR
INR: 1.1 (ref 0.8–1.2)
Prothrombin Time: 13.8 seconds (ref 11.4–15.2)

## 2019-08-31 LAB — APTT: aPTT: 31 seconds (ref 24–36)

## 2019-08-31 LAB — BASIC METABOLIC PANEL
Anion gap: 12 (ref 5–15)
BUN: <5 mg/dL — ABNORMAL LOW (ref 6–20)
CO2: 25 mmol/L (ref 22–32)
Calcium: 9.3 mg/dL (ref 8.9–10.3)
Chloride: 97 mmol/L — ABNORMAL LOW (ref 98–111)
Creatinine, Ser: 0.73 mg/dL (ref 0.61–1.24)
GFR calc Af Amer: >60 mL/min (ref >60)
GFR calc non Af Amer: >60 mL/min (ref >60)
Glucose, Bld: 119 mg/dL — ABNORMAL HIGH (ref 70–99)
Potassium: 3.9 mmol/L (ref 3.5–5.1)
Sodium: 134 mmol/L — ABNORMAL LOW (ref 135–145)

## 2019-08-31 MED ORDER — LIDOCAINE-EPINEPHRINE 1 %-1:100000 IJ SOLN
30.0000 mL | Freq: Once | INTRAMUSCULAR | Status: DC
  Filled 2019-08-31: qty 1

## 2019-08-31 MED ORDER — BACITRACIN ZINC 500 UNIT/GM EX OINT
TOPICAL_OINTMENT | CUTANEOUS | Status: AC
  Administered 2019-08-31: 2 via TOPICAL

## 2019-08-31 MED ORDER — FENTANYL CITRATE (PF) 100 MCG/2ML IJ SOLN
50.0000 ug | Freq: Once | INTRAMUSCULAR | Status: AC
  Administered 2019-08-31: 50 ug via INTRAVENOUS
  Filled 2019-08-31: qty 2

## 2019-08-31 MED ORDER — LIDOCAINE-EPINEPHRINE 1 %-1:100000 IJ SOLN
30.0000 mL | Freq: Once | INTRAMUSCULAR | Status: AC
  Administered 2019-08-31: 30 mL
  Filled 2019-08-31: qty 1

## 2019-08-31 NOTE — ED Provider Notes (Signed)
[facility] EMERGENCY DEPARTMENT Provider Note   CSN: [id number] Arrival date & time: 08/31/19  1708     History Chief Complaint  Patient presents with  . Extremity Laceration    Nicholas Villa is a 29 y.o. male.  HPI  Patient is a 29 year old male with no pertinent past medical history presented today for right AC elbow laceration and right forearm laceration.  He states that occurred approximately 45 minutes before arrival in the emergency department.  He states that he slipped and fell forwards while reaching for the door handle to a glass door and struck his hand through the plate glass.  He states that he did not notice a laceration until he pulled his arm back through at which point he notes that he had 2 lacerations.  He denies any hand pain or lacerations.  He states that his most recent tetanus vaccine was approximately 3 years ago.  He states that he immediately applied pressure and came to the emergency department.  He denies any lightheadedness, shortness of breath, chest pain dizziness nausea or vomiting.  Denies any fevers or chills.  He states that the trip was mechanical 1.  Denies any symptoms prior to falling into the door frame.  Patient states it has been dripping blood but denies any pulsatile bleeding.     History reviewed. No pertinent past medical history.  There are no problems to display for this patient.   History reviewed. No pertinent surgical history.     No family history on file.  Social History   Tobacco Use  . Smoking status: Current Every Day Smoker    Packs/day: 0.50    Types: Cigarettes  . Smokeless tobacco: Never Used  Vaping Use  . Vaping Use: Never used  Substance Use Topics  . Alcohol use: No  . Drug use: No    Home Medications Prior to Admission medications   Medication Sig Start Date End Date Taking? Authorizing Provider  albuterol (PROVENTIL HFA;VENTOLIN HFA) 108 (90 Base) MCG/ACT inhaler Inhale 2  puffs into the lungs every 6 (six) hours as needed for wheezing or shortness of breath. 07/19/15   Menshew, Charlesetta Ivory, PA-C  benzonatate (TESSALON PERLES) 100 MG capsule Take 1 capsule (100 mg total) by mouth 3 (three) times daily as needed for cough (Take 1-2 per dose). 07/19/15   Menshew, Charlesetta Ivory, PA-C  ondansetron (ZOFRAN ODT) 4 MG disintegrating tablet  ODT q4 hours prn nausea/vomit 12/15/17   Bethann Berkshire, MD    Allergies    Patient has no known allergies.  Review of Systems   Review of Systems  Constitutional: Negative for chills and fever.  HENT: Negative for congestion.   Respiratory: Negative for shortness of breath.   Cardiovascular: Negative for chest pain.  Gastrointestinal: Negative for abdominal pain.  Musculoskeletal: Negative for neck pain.  Skin: Positive for wound.    Physical Exam Updated Vital Signs BP 130/78   Pulse 77   Temp 98.1 F (36.7 C) (Axillary)   Resp 17   Ht  (1.88 m)   Wt 68 kg   SpO2 100%   BMI 19.26 kg/m   Physical Exam Vitals and nursing note reviewed.  Constitutional:      General: He is not in acute distress.    Appearance: Normal appearance. He is not ill-appearing.  HENT:     Head: Normocephalic and atraumatic.     Mouth/Throat:     Mouth: Mucous membranes are moist.  Eyes:     General: No scleral icterus.       Right eye: No discharge.        Left eye: No discharge.     Conjunctiva/sclera: Conjunctivae normal.  Cardiovascular:     Rate and Rhythm: Tachycardia present.     Comments: HR 106 Pulmonary:     Effort: Pulmonary effort is normal.     Breath sounds: No stridor.  Musculoskeletal:        General: Normal range of motion.     Comments: Full range of motion of wrist, fingers, elbow, shoulder.  Skin:    General: Skin is warm and dry.     Capillary Refill: Capillary refill takes less than 2 seconds.          Comments: Right arm: Lacerations to the anterior forearm and [facility]  2 large dorsal  forearm lacerations, one small dorsal forearm laceration, 1 deep AC laceration.  10 cm deep gaping laceration with muscular protrusion to the dorsal forearm.  4 cm laceration located below this proximal on the arm.  1 cm laceration above the centimeter laceration approximately 6 inches distal on the dorsal forearm.  There is a 4 cm laceration through the adipose tissue located [facility] the [facility].  Neurological:     Mental Status: He is alert and oriented to person, place, and time. Mental status is at baseline.     Comments: Sensation touch in all fingertips, median, radial, ulnar distribution.     ED Results / Procedures / Treatments   Labs (all labs ordered are listed, but only abnormal results are displayed) Labs Reviewed  BASIC METABOLIC PANEL - Abnormal; Notable for the following components:      Result Value   Sodium 134 (*)    Chloride 97 (*)    Glucose, Bld 119 (*)    BUN <5 (*)    All other components within normal limits  CBC WITH DIFFERENTIAL/PLATELET  PROTIME-INR  APTT    EKG None  Radiology No results found.  Procedures .Marland Kitchen.Laceration Repair  Date/Time: 09/03/2019 12:47 PM Performed by: Gailen ShelterFondaw, Daja Shuping S, PA Authorized by: Gailen ShelterFondaw, Britten Parady S, PA   Consent:    Consent obtained:  Verbal   Consent given by:  Patient   Risks discussed:  Infection, need for additional repair, pain, poor cosmetic result and poor wound healing   Alternatives discussed:  No treatment and delayed treatment Universal protocol:    Procedure explained and questions answered to patient or proxy's satisfaction: yes     Relevant documents present and verified: yes     Test results available and properly labeled: yes     Imaging studies available: yes     Required blood products, implants, devices, and special equipment available: yes     Site/side marked: yes     Immediately prior to procedure, a time out was called: yes     Patient identity confirmed:  Verbally with patient Anesthesia (see  MAR for exact dosages):    Anesthesia method:  Local infiltration   Local anesthetic:  Lidocaine 1% WITH epi Laceration details:    Location:  Shoulder/arm   Shoulder/arm location:  L lower arm   Length (cm):  10 Repair type:    Repair type:  Complex Pre-procedure details:    Preparation:  Patient was prepped and draped in usual sterile fashion Exploration:    Hemostasis achieved with:  Direct pressure and epinephrine   Wound extent: areolar tissue violated, muscle damage and vascular  damage (small vein laceration)     Wound extent: no foreign bodies/material noted, no nerve damage noted and no tendon damage noted     Contaminated: no   Treatment:    Area cleansed with:  Saline   Amount of cleaning:  Extensive   Irrigation solution:  Sterile saline   Irrigation volume:  300   Irrigation method:  Pressure wash   Visualized foreign bodies/material removed: no     Debridement:  None Subcutaneous repair:    Suture size:  4-0   Suture material:  Vicryl   Subcutaneous suture technique: deep dermal.   Number of sutures:  7 Skin repair:    Repair method:  Sutures   Suture size:  3-0   Suture material:  Prolene   Suture technique:  Simple interrupted   Number of sutures:  9 Approximation:    Approximation:  Close Post-procedure details:    Dressing:  Antibiotic ointment and non-adherent dressing   Patient tolerance of procedure:  Tolerated well, no immediate complications .Marland KitchenLaceration Repair  Date/Time: 09/03/2019 12:50 PM Performed by: Gailen Shelter, PA Authorized by: Gailen Shelter, PA   Consent:    Consent obtained:  Verbal   Consent given by:  Patient   Risks discussed:  Infection, need for additional repair, pain, poor cosmetic result and poor wound healing   Alternatives discussed:  No treatment and delayed treatment Universal protocol:    Procedure explained and questions answered to patient or proxy's satisfaction: yes     Relevant documents present and  verified: yes     Test results available and properly labeled: yes     Imaging studies available: yes     Required blood products, implants, devices, and special equipment available: yes     Site/side marked: yes     Immediately prior to procedure, a time out was called: yes     Patient identity confirmed:  Verbally with patient Anesthesia (see MAR for exact dosages):    Anesthesia method:  Local infiltration   Local anesthetic:  Lidocaine 1% WITH epi Laceration details:    Location:  Shoulder/arm   Shoulder/arm location:  L lower arm   Length (cm):  4 Repair type:    Repair type:  Complex Exploration:    Hemostasis achieved with:  Direct pressure and epinephrine   Wound exploration: wound explored through full range of motion and entire depth of wound probed and visualized     Wound extent: no foreign bodies/material noted and no tendon damage noted     Contaminated: no   Treatment:    Area cleansed with:  Saline   Amount of cleaning:  Extensive   Irrigation solution:  Sterile saline and sterile water   Irrigation volume:  100   Irrigation method:  Pressure wash   Visualized foreign bodies/material removed: no   Skin repair:    Repair method:  Sutures   Suture size:  3-0   Suture material:  Prolene   Suture technique:  Simple interrupted   Number of sutures:  4 Approximation:    Approximation:  Close Post-procedure details:    Dressing:  Antibiotic ointment and non-adherent dressing   Patient tolerance of procedure:  Tolerated well, no immediate complications .Marland KitchenLaceration Repair  Date/Time: 09/03/2019 12:51 PM Performed by: Gailen Shelter, PA Authorized by: Gailen Shelter, PA   Consent:    Consent obtained:  Verbal   Consent given by:  Patient   Risks discussed:  Infection, need for additional repair,  pain, poor cosmetic result and poor wound healing   Alternatives discussed:  No treatment and delayed treatment Universal protocol:    Procedure explained and  questions answered to patient or proxy's satisfaction: yes     Relevant documents present and verified: yes     Test results available and properly labeled: yes     Imaging studies available: yes     Required blood products, implants, devices, and special equipment available: yes     Site/side marked: yes     Immediately prior to procedure, a time out was called: yes     Patient identity confirmed:  Verbally with patient Anesthesia (see MAR for exact dosages):    Anesthesia method:  Local infiltration   Local anesthetic:  Lidocaine 1% WITH epi Laceration details:    Location:  Shoulder/arm   Shoulder/arm location:  L lower arm   Length (cm):  1 Repair type:    Repair type:  Simple Exploration:    Hemostasis achieved with:  Direct pressure and epinephrine   Wound extent: no foreign bodies/material noted and no tendon damage noted     Contaminated: no   Treatment:    Area cleansed with:  Saline   Amount of cleaning:  Standard   Irrigation solution:  Sterile saline   Irrigation volume:  100   Irrigation method:  Pressure wash   Visualized foreign bodies/material removed: no   Skin repair:    Repair method:  Sutures   Suture size:  3-0   Suture material:  Prolene   Suture technique:  Simple interrupted   Number of sutures:  1 Approximation:    Approximation:  Close Post-procedure details:    Dressing:  Antibiotic ointment and non-adherent dressing   Patient tolerance of procedure:  Tolerated well, no immediate complications .Marland KitchenLaceration Repair  Date/Time: 09/03/2019 12:52 PM Performed by: Gailen Shelter, PA Authorized by: Gailen Shelter, PA   Consent:    Consent obtained:  Verbal   Consent given by:  Patient   Risks discussed:  Infection, need for additional repair, pain, poor cosmetic result and poor wound healing   Alternatives discussed:  No treatment and delayed treatment Universal protocol:    Procedure explained and questions answered to patient or proxy's  satisfaction: yes     Relevant documents present and verified: yes     Test results available and properly labeled: yes     Imaging studies available: yes     Required blood products, implants, devices, and special equipment available: yes     Site/side marked: yes     Immediately prior to procedure, a time out was called: yes     Patient identity confirmed:  Verbally with patient Anesthesia (see MAR for exact dosages):    Anesthesia method:  Local infiltration   Local anesthetic:  Lidocaine 1% WITH epi Laceration details:    Location:  Shoulder/arm   Shoulder/arm location:  L elbow (Left AC)   Length (cm):  4 Repair type:    Repair type:  Simple Exploration:    Hemostasis achieved with:  Direct pressure and epinephrine   Wound exploration: wound explored through full range of motion     Wound extent: no foreign bodies/material noted and no tendon damage noted     Contaminated: no   Treatment:    Area cleansed with:  Saline   Amount of cleaning:  Standard   Irrigation solution:  Sterile saline   Irrigation volume:  100   Irrigation method:  Pressure  wash   Visualized foreign bodies/material removed: no   Skin repair:    Repair method:  Sutures   Suture size:  3-0   Suture material:  Prolene   Suture technique:  Simple interrupted   Number of sutures:  3 Approximation:    Approximation:  Close Post-procedure details:    Dressing:  Antibiotic ointment and non-adherent dressing   Patient tolerance of procedure:  Tolerated well, no immediate complications   (including critical care time)  Medications Ordered in ED Medications  fentaNYL (SUBLIMAZE) injection 50 mcg (50 mcg Intravenous Given 08/31/19 1822)  lidocaine-EPINEPHrine (XYLOCAINE W/EPI) 1 %-1:100000 (with pres) injection 30 mL (30 mLs Infiltration Given 08/31/19 1822)  bacitracin ointment (2 application Topical Given 08/31/19 1951)    ED Course  I have reviewed the triage vital signs and the nursing  notes.  Pertinent labs & imaging results that were available during my care of the patient were reviewed by me and considered in my medical decision making (see chart for details).  Clinical Course as of Sep 03 1254  Mon Aug 31, 2019  1935 BMP without acute abnormality.  Coags within normal limits.  CBC without anemia or leukocytosis.   [WF]    Clinical Course User Index [WF] Gailen Shelter, [place]   MDM Rules/Calculators/A&P                          Patient is 29 year old male presenting today with significant laceration to his his right arm occurred when he stuck his arm to play close window.  X-ray shows no retained glass fragments and all lacerations were probed and irrigated to confirm no broken glass inside.  Myself and my attending physician Dr. Clarice Pole evaluated and treated this patient together.  I personally reviewed the x-ray and all blood work.  No abnormalities were noted.  After hemostasis was obtained flat achieved with epinephrine and direct pressure repair was done.  Please see notes for full details.  Patient tolerated procedures well.  He had a total of 17 superficial stitches placed.  He also had 7 deep dermal stitches.  He was recommended to return in 10 days for wound check and consideration of removal of stitches.  He was given a sling for comfort and given multiple times per day arm exercises to prevent adhesive capsulitis.  Pressure irrigation performed. Wound explored and base of wound visualized in a bloodless field without evidence of foreign body.  Laceration occurred < 8 hours prior to repair which was well tolerated. TDAP utd.  Pt has no comorbidities to effect normal wound healing. Pt discharged without antibiotics.  Discussed suture home care with patient and answered questions. Pt to follow-up for wound check and suture removal in 10 days; they are to return to the ED sooner for signs of infection. Pt is hemodynamically stable with no complaints prior to dc.    Patient hemodynamically stable at time of discharge.  Agreeable to plan.  Is given wound care precautions return precautions and wound care instructions.  Final Clinical Impression(s) / ED Diagnoses Final diagnoses:  Laceration of right upper extremity, initial encounter    Rx / DC Orders ED Discharge Orders    None       Gailen Shelter, [place] 09/03/19 1256    Arby Barrette, MD 09/14/19 1556

## 2019-08-31 NOTE — ED Triage Notes (Signed)
Pt from work, tripped and put arm through window in door; multiple lacs to R forearm; pressure dressing applied on arrival

## 2019-08-31 NOTE — ED Notes (Signed)
Dressing applied to pt right arm using bacitracin, non-adherent dressing, ABD pad, and Koban. Shoulder immobilizer applied.  Discharge instructions reviewed with pt. Pt verbalized understanding.

## 2019-08-31 NOTE — Discharge Instructions (Addendum)
Sutured repair Keep the laceration site dry for the next 24 hours and leave the dressing in place. After 24 hours you may remove the dressing and gently clean the laceration site with antibacterial soap and warm water. Do not scrub the area. Do not soak the area and water for long periods of time. Don't use hydrogen peroxide, iodine-based solutions, or alcohol, which can slow healing, and will probably be painful! Apply topical bacitracin 1-2 times per day for the next 3-5 days. Return to the emergency department in 10-14 (preferably 10) days for removal of the sutures.  You should return sooner for any signs of infection which would include increased redness around the wound, increased swelling, new drainage of yellow pus.    Please use Tylenol or ibuprofen for pain.  You may use 600 mg ibuprofen every 6 hours or 1000 mg of Tylenol every 6 hours.  You may choose to alternate between the 2.  This would be most effective.  Not to exceed 4 g of Tylenol within 24 hours.  Not to exceed 3200 mg ibuprofen 24 hours.

## 2019-10-08 ENCOUNTER — Encounter: Payer: Self-pay | Admitting: Emergency Medicine

## 2019-10-08 ENCOUNTER — Other Ambulatory Visit: Payer: Self-pay

## 2019-10-08 ENCOUNTER — Emergency Department
Admission: EM | Admit: 2019-10-08 | Discharge: 2019-10-08 | Disposition: A | Discharge status: Home / Self Care | Payer: Medicaid Other | Attending: Emergency Medicine | Admitting: Emergency Medicine

## 2019-10-08 DIAGNOSIS — F600 Paranoid personality disorder: Secondary | ICD-10-CM | POA: Insufficient documentation

## 2019-10-08 DIAGNOSIS — F22 Delusional disorders: Secondary | ICD-10-CM

## 2019-10-08 DIAGNOSIS — F17210 Nicotine dependence, cigarettes, uncomplicated: Secondary | ICD-10-CM | POA: Diagnosis not present

## 2019-10-08 LAB — COMPREHENSIVE METABOLIC PANEL
ALT: 16 U/L (ref 0–44)
AST: 23 U/L (ref 15–41)
Albumin: 4.7 g/dL (ref 3.5–5.0)
Alkaline Phosphatase: 96 U/L (ref 38–126)
Anion gap: 11 (ref 5–15)
BUN: 6 mg/dL (ref 6–20)
CO2: 27 mmol/L (ref 22–32)
Calcium: 9 mg/dL (ref 8.9–10.3)
Chloride: 100 mmol/L (ref 98–111)
Creatinine, Ser: 0.62 mg/dL (ref 0.61–1.24)
GFR calc Af Amer: >60 mL/min (ref >60)
GFR calc non Af Amer: >60 mL/min (ref >60)
Glucose, Bld: 112 mg/dL — ABNORMAL HIGH (ref 70–99)
Potassium: 3.8 mmol/L (ref 3.5–5.1)
Sodium: 138 mmol/L (ref 135–145)
Total Bilirubin: 1 mg/dL (ref 0.3–1.2)
Total Protein: 7.9 g/dL (ref 6.5–8.1)

## 2019-10-08 LAB — CBC
HCT: 42.9 % (ref 39.0–52.0)
Hemoglobin: 14.6 g/dL (ref 13.0–17.0)
MCH: 29.2 pg (ref 26.0–34.0)
MCHC: 34 g/dL (ref 30.0–36.0)
MCV: 85.8 fL (ref 80.0–100.0)
Platelets: 240 10*3/uL (ref 150–400)
RBC: 5 MIL/uL (ref 4.22–5.81)
RDW: 12 % (ref 11.5–15.5)
WBC: 8 10*3/uL (ref 4.0–10.5)
nRBC: 0 % (ref 0.0–0.2)

## 2019-10-08 LAB — SALICYLATE LEVEL: Salicylate Lvl: <7 mg/dL — ABNORMAL LOW (ref 7.0–30.0)

## 2019-10-08 LAB — ACETAMINOPHEN LEVEL: Acetaminophen (Tylenol), Serum: <10 ug/mL — ABNORMAL LOW (ref 10–30)

## 2019-10-08 LAB — ETHANOL: Alcohol, Ethyl (B): <10 mg/dL (ref <10)

## 2019-10-08 NOTE — ED Notes (Signed)
Pt dressed into hospital provided attire via this RN and EDT, John.  Belongings placed in labeled bag, handed off to quad RN.  Belongings include:  Blue Tshirt [employer] Fila Tennis Shoes Boxers Socks [place]; $21 [employer] Cigarettes (2) Birth Orthoptist (2)

## 2019-10-08 NOTE — ED Triage Notes (Signed)
Pt mom reports pt is paranoid. Pt denies SI. Mom reports pt is on a mental break

## 2019-10-08 NOTE — ED Provider Notes (Signed)
[facility] Emergency Department Provider Note   ____________________________________________   First MD Initiated Contact with Patient 10/08/19 1954     (approximate)  I have reviewed the triage vital signs and the nursing notes.   HISTORY  Chief Complaint Behavior Problem    HPI DEREN DEGRAZIA is a 29 y.o. male with no significant past medical history who presents to the ED complaining of paranoia.  Patient reports that he recently separated from his wife and has been having paranoid thoughts related to the break-up.  He feels that people are acting negatively towards him, but he denies any auditory or visual hallucinations.  He denies any thoughts of harming himself or others, but is interested in talking to a psychiatrist.  He denies any medical complaints at this time, also denies any alcohol or drug abuse.        History reviewed. No pertinent past medical history.  There are no problems to display for this patient.   History reviewed. No pertinent surgical history.  Prior to Admission medications   Medication Sig Start Date End Date Taking? Authorizing Provider  albuterol (PROVENTIL HFA;VENTOLIN HFA) 108 (90 Base) MCG/ACT inhaler Inhale 2 puffs into the lungs every 6 (six) hours as needed for wheezing or shortness of breath. 07/19/15   Menshew, Charlesetta Ivory, PA-C  benzonatate (TESSALON PERLES) 100 MG capsule Take 1 capsule (100 mg total) by mouth 3 (three) times daily as needed for cough (Take 1-2 per dose). 07/19/15   Menshew, Charlesetta Ivory, PA-C  ondansetron (ZOFRAN ODT) 4 MG disintegrating tablet 4mg  ODT q4 hours prn nausea/vomit 12/15/17   12/17/17, MD    Allergies Patient has no known allergies.  No family history on file.  Social History Social History   Tobacco Use  . Smoking status: Current Every Day Smoker    Packs/day: 0.50    Types: Cigarettes  . Smokeless tobacco: Never Used  Vaping Use  . Vaping Use: Never  used  Substance Use Topics  . Alcohol use: No  . Drug use: No    Review of Systems  Constitutional: No fever/chills Eyes: No visual changes. ENT: No sore throat. Cardiovascular: Denies chest pain. Respiratory: Denies shortness of breath. Gastrointestinal: No abdominal pain.  No nausea, no vomiting.  No diarrhea.  No constipation. Genitourinary: Negative for dysuria. Musculoskeletal: Negative for back pain. Skin: Negative for rash. Neurological: Negative for headaches, focal weakness or numbness.  Positive for paranoia.  ____________________________________________   PHYSICAL EXAM:  VITAL SIGNS: ED Triage Vitals  Enc Vitals Group     BP 10/08/19 1942 (!) 150/95     Pulse Rate 10/08/19 1942 83     Resp 10/08/19 1942 16     Temp 10/08/19 1942 98.2 F (36.8 C)     Temp Source 10/08/19 1942 Oral     SpO2 10/08/19 1942 100 %     Weight 10/08/19 1859 150 lb (68 kg)     Height 10/08/19 1859 6\' 1"  (1.854 m)     Head Circumference --      Peak Flow --      Pain Score 10/08/19 1859 0     Pain Loc --      Pain Edu? --      Excl. in GC? --     Constitutional: Alert and oriented. Eyes: Conjunctivae are normal. Head: Atraumatic. Nose: No congestion/rhinnorhea. Mouth/Throat: Mucous membranes are moist. Neck: Normal ROM Cardiovascular: Normal rate, regular rhythm. Grossly normal heart sounds. Respiratory:  Normal respiratory effort.  No retractions. Lungs CTAB. Gastrointestinal: Soft and nontender. No distention. Genitourinary: deferred Musculoskeletal: No lower extremity tenderness nor edema. Neurologic:  Normal speech and language. No gross focal neurologic deficits are appreciated. Skin:  Skin is warm, dry and intact. No rash noted. Psychiatric: Mood and affect are normal. Speech and behavior are normal.  ____________________________________________   LABS (all labs ordered are listed, but only abnormal results are displayed)  Labs Reviewed  COMPREHENSIVE METABOLIC  PANEL - Abnormal; Notable for the following components:      Result Value   Glucose, Bld 112 (*)    All other components within normal limits  SALICYLATE LEVEL - Abnormal; Notable for the following components:   Salicylate Lvl <7.0 (*)    All other components within normal limits  ACETAMINOPHEN LEVEL - Abnormal; Notable for the following components:   Acetaminophen (Tylenol), Serum <10 (*)    All other components within normal limits  ETHANOL  CBC  URINE DRUG SCREEN, QUALITATIVE ([facility] ONLY)    PROCEDURES  Procedure(s) performed (including Critical Care):  Procedures   ____________________________________________   INITIAL IMPRESSION / ASSESSMENT AND PLAN / ED COURSE       29 year old male with no significant past medical history presents to the ED complaining of paranoid thoughts but specifically denies any hallucinations, suicidal or homicidal ideation.  Patient is oriented at this time with no signs of psychosis and there is no indication for IVC.  He expresses interest in being seen by psychiatry, but currently does not wish to stay here in the ED.  He is requesting outpatient resources in order to follow-up with psychiatry and at this point he is appropriate for discharge home with outpatient follow-up.  He was counseled to return to the ED for new worsening symptoms, patient agrees with plan.      ____________________________________________   FINAL CLINICAL IMPRESSION(S) / ED DIAGNOSES  Final diagnoses:  Paranoia [facility]     ED Discharge Orders    None       Note:  This document was prepared using Dragon voice recognition software and may include unintentional dictation errors.   Chesley Noon, MD 10/08/19 2035

## 2019-10-08 NOTE — ED Notes (Signed)
Pt reports desire to leave at this time. MD notified and assess pt. Pt denies SI, HI and AVH, MD states he will get DC paperwork together for pt with outpatient resources

## 2019-10-08 NOTE — ED Notes (Signed)
Gave pt [place] tray and ginger ale.AS

## 2020-08-29 IMAGING — DX DG FOREARM 2V*R*
2 series · 2 of 2 positions shown · non-contrast
Comparison: None.

CLINICAL DATA: Laceration.

EXAM:
RIGHT FOREARM - 2 VIEW

[forearm ap]
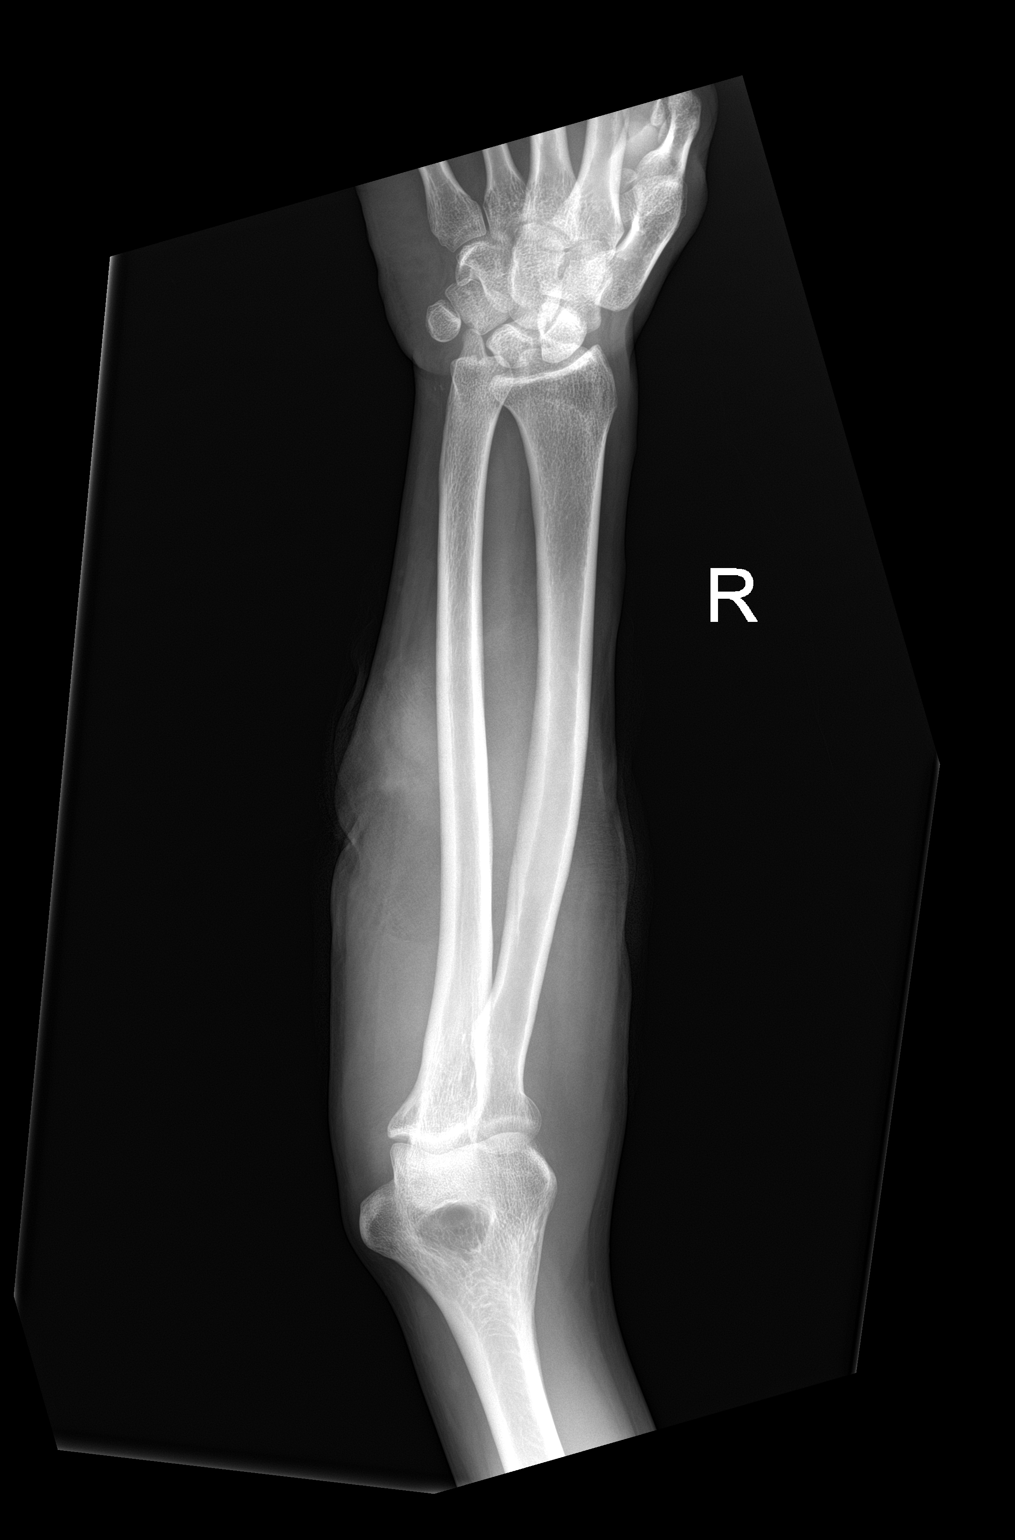

[forearm lat]
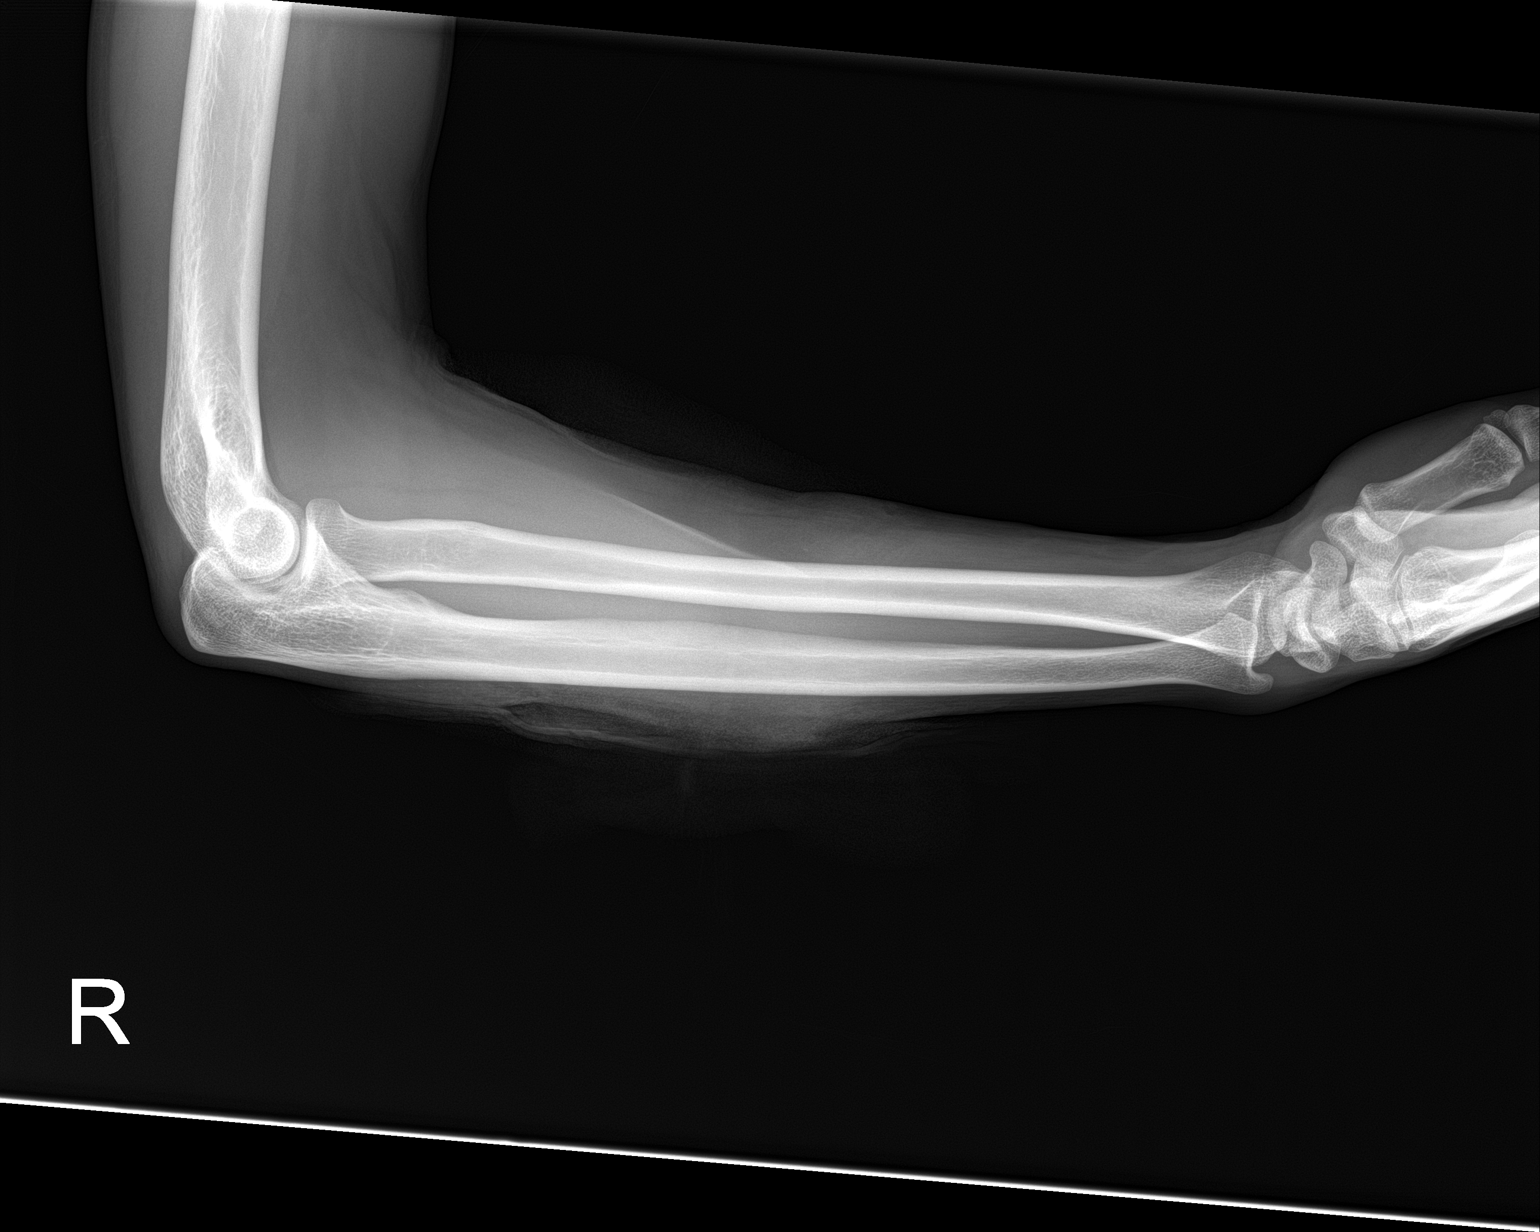

[2 of 2 positions shown; findings below may reference images not displayed]

FINDINGS: There is a large soft tissue defect involving the mid forearm. There
is no definite radiopaque foreign body associated with this
laceration. There is no acute displaced fracture or dislocation.
Pockets of subcutaneous gas are noted. There is no elbow joint
effusion.
IMPRESSION: 1. No acute displaced fracture or dislocation.
2. Soft tissue laceration involving the mid forearm with associated
subcutaneous gas. No definite radiopaque foreign body.
